# Patient Record
Sex: Female | Born: 1940 | Race: White | Hispanic: No | Marital: Married | Smoking: Former smoker
Health system: Southern US, Community
[De-identification: ages and names within clinical notes are randomized; demographics above are authoritative.]

## PROBLEM LIST (undated history)

## (undated) DIAGNOSIS — L570 Actinic keratosis: Secondary | ICD-10-CM

## (undated) HISTORY — DX: Actinic keratosis: L57.0

---

## 1998-04-17 HISTORY — PX: ABDOMINAL HYSTERECTOMY: SHX81

## 2005-05-01 ENCOUNTER — Ambulatory Visit: Payer: Self-pay | Admitting: Nurse Practitioner

## 2006-06-01 ENCOUNTER — Ambulatory Visit: Payer: Self-pay | Admitting: *Deleted

## 2006-08-17 ENCOUNTER — Ambulatory Visit: Payer: Self-pay | Admitting: *Deleted

## 2006-08-18 ENCOUNTER — Ambulatory Visit: Payer: Self-pay | Admitting: Gastroenterology

## 2007-06-22 ENCOUNTER — Ambulatory Visit: Payer: Self-pay | Admitting: *Deleted

## 2008-03-21 ENCOUNTER — Ambulatory Visit: Payer: Self-pay | Admitting: *Deleted

## 2008-08-23 ENCOUNTER — Ambulatory Visit: Payer: Self-pay | Admitting: Family Medicine

## 2009-04-17 HISTORY — PX: KNEE SURGERY: SHX244

## 2009-08-27 ENCOUNTER — Ambulatory Visit: Payer: Self-pay | Admitting: Family Medicine

## 2010-04-30 ENCOUNTER — Ambulatory Visit: Payer: Self-pay | Admitting: Family Medicine

## 2010-06-26 ENCOUNTER — Ambulatory Visit: Payer: Self-pay | Admitting: Family Medicine

## 2011-02-22 ENCOUNTER — Ambulatory Visit: Payer: Self-pay | Admitting: Orthopedic Surgery

## 2011-03-31 ENCOUNTER — Ambulatory Visit: Payer: Self-pay | Admitting: Orthopedic Surgery

## 2011-11-18 HISTORY — PX: OTHER SURGICAL HISTORY: SHX169

## 2013-11-01 ENCOUNTER — Ambulatory Visit: Payer: Self-pay | Admitting: Family Medicine

## 2013-11-07 ENCOUNTER — Ambulatory Visit: Payer: Self-pay | Admitting: Family Medicine

## 2013-11-14 ENCOUNTER — Ambulatory Visit: Payer: Self-pay | Admitting: Family Medicine

## 2013-11-14 HISTORY — PX: BREAST BIOPSY: SHX20

## 2013-11-15 LAB — PATHOLOGY REPORT

## 2014-11-02 ENCOUNTER — Ambulatory Visit: Payer: Self-pay | Admitting: Family Medicine

## 2015-08-28 ENCOUNTER — Other Ambulatory Visit: Payer: Self-pay | Admitting: Family Medicine

## 2015-08-28 DIAGNOSIS — Z1231 Encounter for screening mammogram for malignant neoplasm of breast: Secondary | ICD-10-CM

## 2015-11-05 ENCOUNTER — Ambulatory Visit: Payer: Self-pay | Attending: Family Medicine

## 2015-11-09 ENCOUNTER — Ambulatory Visit
Admission: RE | Admit: 2015-11-09 | Discharge: 2015-11-09 | Disposition: A | Discharge status: Home / Self Care | Payer: Medicare PPO | Source: Ambulatory Visit | Attending: Family Medicine | Admitting: Family Medicine

## 2015-11-09 ENCOUNTER — Other Ambulatory Visit: Payer: Self-pay | Admitting: Family Medicine

## 2015-11-09 DIAGNOSIS — Z1231 Encounter for screening mammogram for malignant neoplasm of breast: Secondary | ICD-10-CM

## 2016-10-22 ENCOUNTER — Other Ambulatory Visit: Payer: Self-pay | Admitting: Nurse Practitioner

## 2016-10-22 DIAGNOSIS — Z1231 Encounter for screening mammogram for malignant neoplasm of breast: Secondary | ICD-10-CM

## 2016-11-24 ENCOUNTER — Ambulatory Visit: Payer: Medicare PPO

## 2016-12-17 ENCOUNTER — Ambulatory Visit
Admission: RE | Admit: 2016-12-17 | Discharge: 2016-12-17 | Disposition: A | Discharge status: Home / Self Care | Payer: Medicare Other | Source: Ambulatory Visit | Attending: Nurse Practitioner | Admitting: Nurse Practitioner

## 2016-12-17 DIAGNOSIS — Z1231 Encounter for screening mammogram for malignant neoplasm of breast: Secondary | ICD-10-CM | POA: Insufficient documentation

## 2017-11-13 ENCOUNTER — Other Ambulatory Visit: Payer: Self-pay | Admitting: Endocrinology

## 2017-11-13 DIAGNOSIS — Z1231 Encounter for screening mammogram for malignant neoplasm of breast: Secondary | ICD-10-CM

## 2017-12-22 ENCOUNTER — Ambulatory Visit
Admission: RE | Admit: 2017-12-22 | Discharge: 2017-12-22 | Disposition: A | Discharge status: Home / Self Care | Payer: Medicare Other | Source: Ambulatory Visit | Attending: Endocrinology | Admitting: Endocrinology

## 2017-12-22 DIAGNOSIS — Z1231 Encounter for screening mammogram for malignant neoplasm of breast: Secondary | ICD-10-CM | POA: Diagnosis present

## 2018-11-29 ENCOUNTER — Other Ambulatory Visit: Payer: Self-pay | Admitting: Nurse Practitioner

## 2018-11-29 ENCOUNTER — Other Ambulatory Visit: Payer: Self-pay | Admitting: Endocrinology

## 2018-11-29 DIAGNOSIS — Z1231 Encounter for screening mammogram for malignant neoplasm of breast: Secondary | ICD-10-CM

## 2018-12-29 ENCOUNTER — Ambulatory Visit
Admission: RE | Admit: 2018-12-29 | Discharge: 2018-12-29 | Disposition: A | Discharge status: Home / Self Care | Payer: Medicare Other | Source: Ambulatory Visit | Attending: Nurse Practitioner | Admitting: Nurse Practitioner

## 2018-12-29 DIAGNOSIS — Z1231 Encounter for screening mammogram for malignant neoplasm of breast: Secondary | ICD-10-CM | POA: Diagnosis present

## 2019-11-28 ENCOUNTER — Other Ambulatory Visit: Payer: Self-pay | Admitting: Surgery

## 2019-11-28 DIAGNOSIS — Z1231 Encounter for screening mammogram for malignant neoplasm of breast: Secondary | ICD-10-CM

## 2020-02-28 ENCOUNTER — Ambulatory Visit: Payer: Medicare PPO | Admitting: Dermatology

## 2020-02-28 ENCOUNTER — Other Ambulatory Visit: Payer: Self-pay

## 2020-02-28 DIAGNOSIS — D2239 Melanocytic nevi of other parts of face: Secondary | ICD-10-CM

## 2020-02-28 DIAGNOSIS — L578 Other skin changes due to chronic exposure to nonionizing radiation: Secondary | ICD-10-CM | POA: Diagnosis not present

## 2020-02-28 DIAGNOSIS — L570 Actinic keratosis: Secondary | ICD-10-CM | POA: Diagnosis not present

## 2020-02-28 DIAGNOSIS — L814 Other melanin hyperpigmentation: Secondary | ICD-10-CM

## 2020-02-28 DIAGNOSIS — D229 Melanocytic nevi, unspecified: Secondary | ICD-10-CM

## 2020-02-28 NOTE — Progress Notes (Signed)
   Follow-Up Visit   Subjective  Caitlin Berry is a 79 y.o. female who presents for the following: Follow-up (AKs - nasal bridge and R cheek).   The following portions of the chart were reviewed this encounter and updated as appropriate:     Review of Systems: No other skin or systemic complaints.  Objective  Well appearing patient in no apparent distress; mood and affect are within normal limits.  A focused examination was performed including face. Relevant physical exam findings are noted in the Assessment and Plan.  Objective  Right Malar Cheek x 1: Residual erythematous thin macule with gritty scale. Nose is clear  Objective  Left Lower Cheek: 4.46mm speckled brown macule, present for >10 years, no changes per patient  Assessment & Plan   Actinic Damage - diffuse scaly erythematous macules with underlying dyspigmentation - Recommend daily broad spectrum sunscreen SPF 30+ to sun-exposed areas, reapply every 2 hours as needed.  - Call for new or changing lesions.  Lentigines - Scattered tan macules - Discussed due to sun exposure - Benign, observe - Call for any changes    AK (actinic keratosis) Right Malar Cheek x 1  Destruction of lesion - Right Malar Cheek x 1  Destruction method: cryotherapy   Informed consent: discussed and consent obtained   Lesion destroyed using liquid nitrogen: Yes   Region frozen until ice ball extended beyond lesion: Yes   Outcome: patient tolerated procedure well with no complications   Post-procedure details: wound care instructions given    Nevus Left Lower Cheek  Vs Lentigo  Benign-appearing.  Observation.  Call clinic for new or changing moles.  Recommend daily use of broad spectrum spf 30+ sunscreen to sun-exposed areas.    Return in about 1 year (around 02/27/2021) for TBSE.   IJamesetta Orleans, CMA, am acting as scribe for Brendolyn Patty, MD .

## 2020-02-28 NOTE — Patient Instructions (Signed)
Cryotherapy Aftercare  . Wash gently with soap and water everyday.   . Apply Vaseline and Band-Aid daily until healed.  

## 2020-08-08 ENCOUNTER — Other Ambulatory Visit: Payer: Self-pay

## 2020-08-08 ENCOUNTER — Ambulatory Visit
Admission: RE | Admit: 2020-08-08 | Discharge: 2020-08-08 | Disposition: A | Discharge status: Home / Self Care | Payer: Medicare PPO | Source: Ambulatory Visit | Attending: Surgery | Admitting: Surgery

## 2020-08-08 DIAGNOSIS — Z1231 Encounter for screening mammogram for malignant neoplasm of breast: Secondary | ICD-10-CM | POA: Insufficient documentation

## 2020-10-23 ENCOUNTER — Encounter: Payer: Self-pay | Admitting: Dermatology

## 2021-03-05 ENCOUNTER — Encounter: Payer: Medicare PPO | Admitting: Dermatology

## 2021-07-01 ENCOUNTER — Other Ambulatory Visit: Payer: Self-pay | Admitting: Family Medicine

## 2021-07-01 ENCOUNTER — Other Ambulatory Visit: Payer: Self-pay | Admitting: Surgery

## 2021-07-01 DIAGNOSIS — Z1231 Encounter for screening mammogram for malignant neoplasm of breast: Secondary | ICD-10-CM

## 2021-08-09 ENCOUNTER — Other Ambulatory Visit: Payer: Self-pay

## 2021-08-09 ENCOUNTER — Ambulatory Visit
Admission: RE | Admit: 2021-08-09 | Discharge: 2021-08-09 | Disposition: A | Discharge status: Home / Self Care | Payer: Medicare PPO | Source: Ambulatory Visit | Attending: Family Medicine | Admitting: Family Medicine

## 2021-08-09 DIAGNOSIS — Z1231 Encounter for screening mammogram for malignant neoplasm of breast: Secondary | ICD-10-CM | POA: Diagnosis present

## 2022-07-22 ENCOUNTER — Other Ambulatory Visit: Payer: Self-pay | Admitting: Family Medicine

## 2022-07-22 DIAGNOSIS — Z1231 Encounter for screening mammogram for malignant neoplasm of breast: Secondary | ICD-10-CM

## 2022-08-15 ENCOUNTER — Ambulatory Visit
Admission: RE | Admit: 2022-08-15 | Discharge: 2022-08-15 | Disposition: A | Discharge status: Home / Self Care | Payer: Medicare PPO | Source: Ambulatory Visit | Attending: Family Medicine | Admitting: Family Medicine

## 2022-08-15 DIAGNOSIS — Z1231 Encounter for screening mammogram for malignant neoplasm of breast: Secondary | ICD-10-CM | POA: Diagnosis not present

## 2023-03-16 DIAGNOSIS — E119 Type 2 diabetes mellitus without complications: Secondary | ICD-10-CM | POA: Insufficient documentation

## 2023-03-16 LAB — HM DIABETES FOOT EXAM

## 2023-07-21 ENCOUNTER — Other Ambulatory Visit: Payer: Self-pay | Admitting: Family Medicine

## 2023-07-21 DIAGNOSIS — Z1231 Encounter for screening mammogram for malignant neoplasm of breast: Secondary | ICD-10-CM

## 2023-08-08 IMAGING — MG MM DIGITAL SCREENING BILAT W/ TOMO AND CAD
8 series · 8 of 24 positions shown · non-contrast
Comparison: Previous exam(s).

CLINICAL DATA: Screening.

EXAM:
DIGITAL SCREENING BILATERAL MAMMOGRAM WITH TOMOSYNTHESIS AND CAD
TECHNIQUE: Bilateral screening digital craniocaudal and mediolateral oblique
mammograms were obtained. Bilateral screening digital breast
tomosynthesis was performed. The images were evaluated with
computer-aided detection.

[L CC synth-2D]
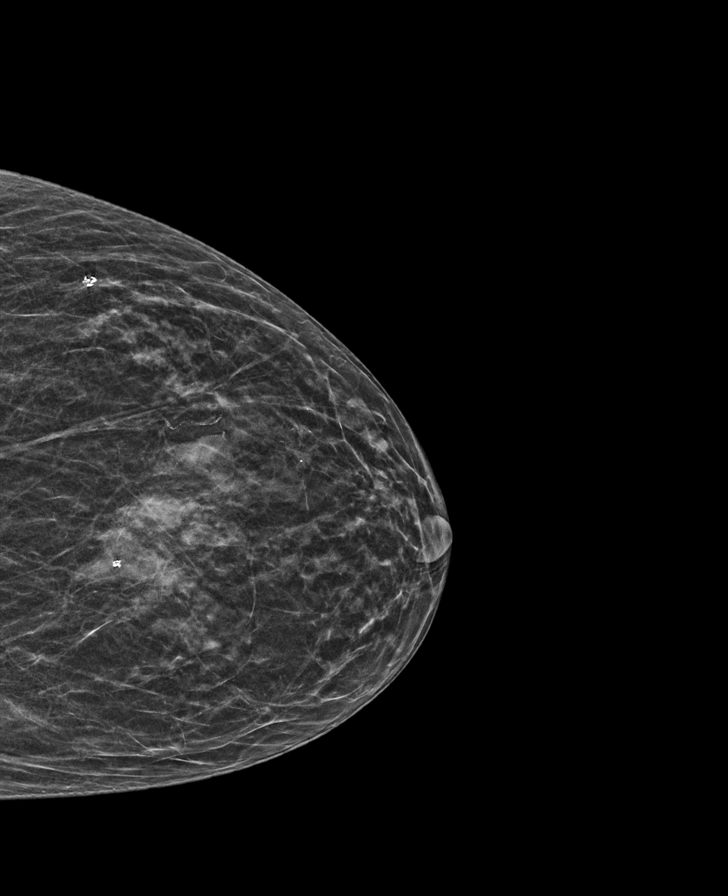

[R MLO synth-2D]
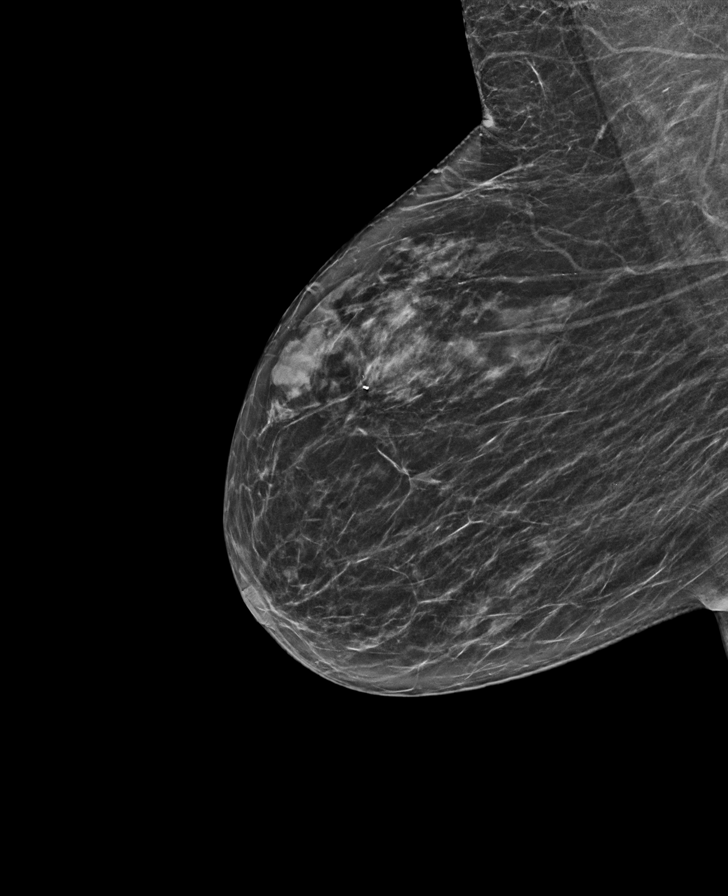

[R CC synth-2D]
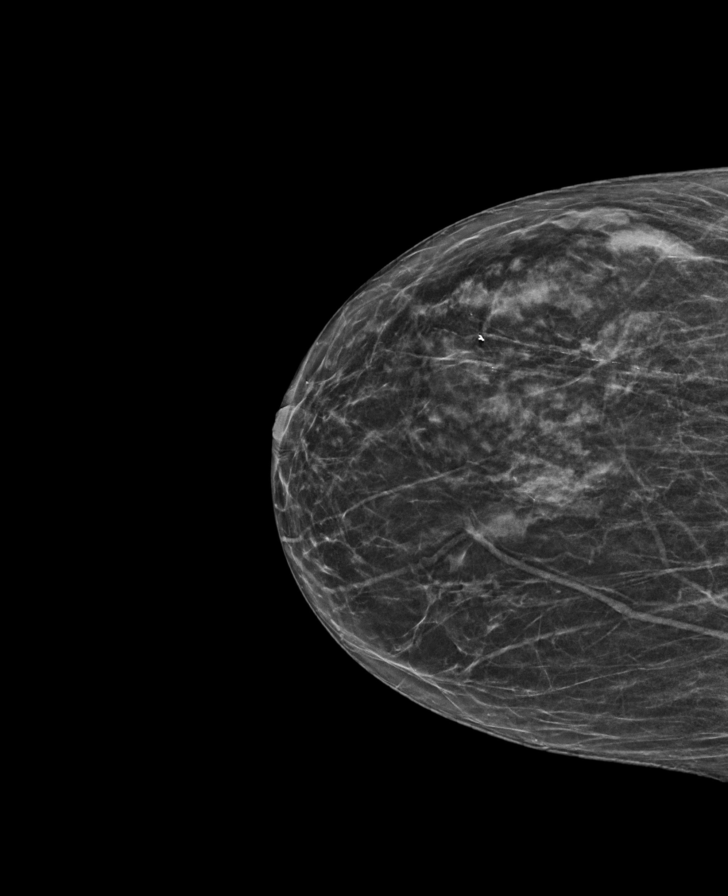

[L MLO synth-2D]
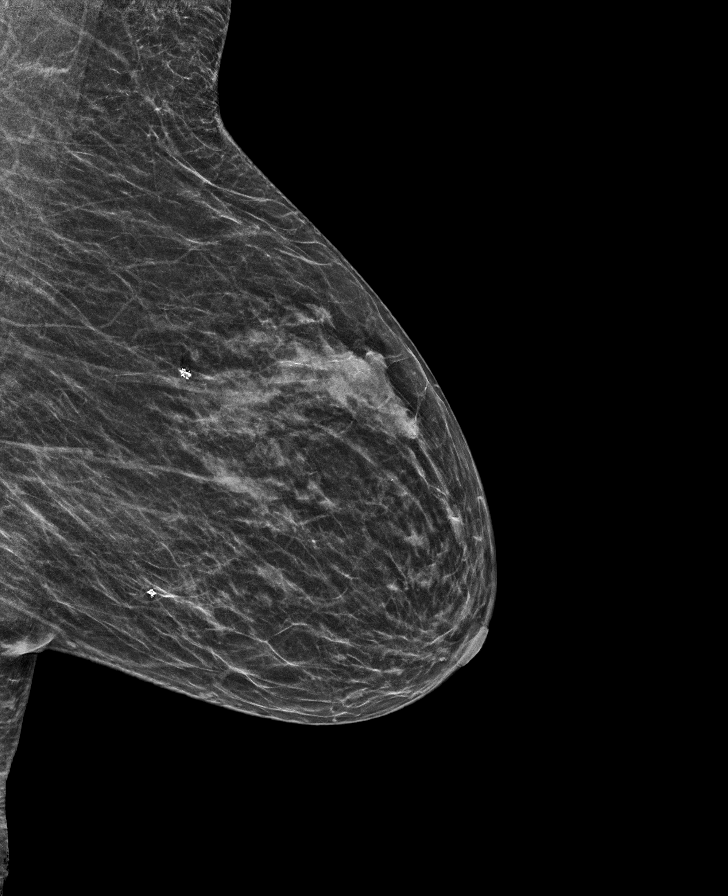

[R MLO tomo · tomo slice 31/62.0]
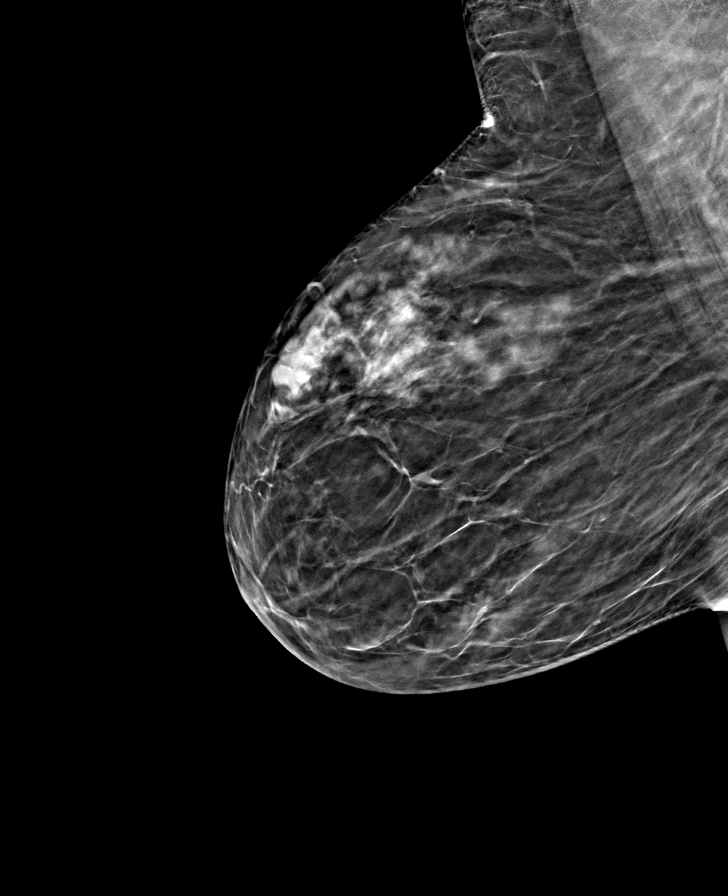

[L MLO tomo · tomo slice 24/47.0]
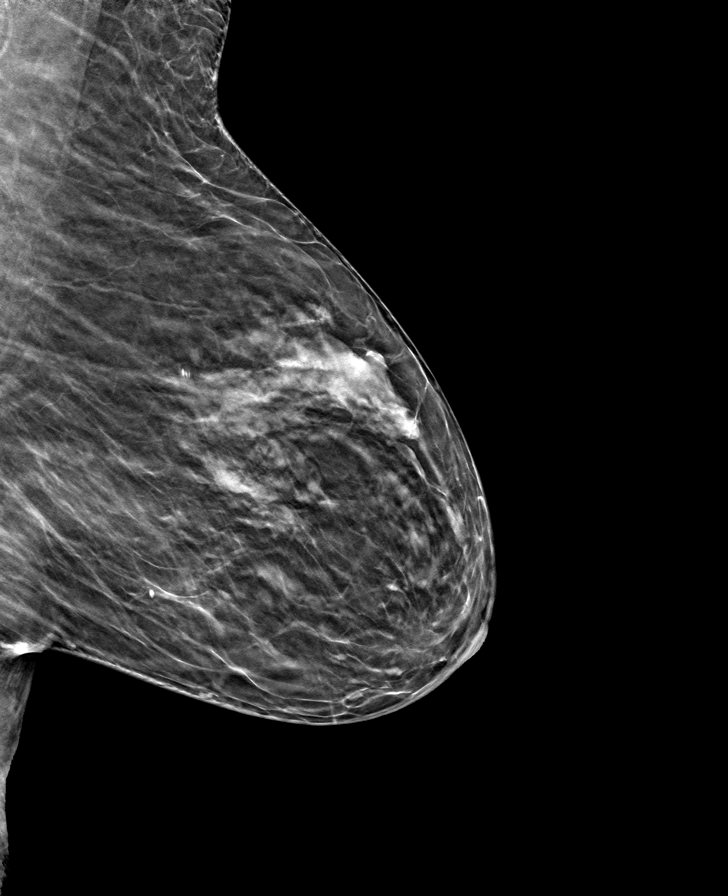

[R CC tomo · tomo slice 25/50.0]
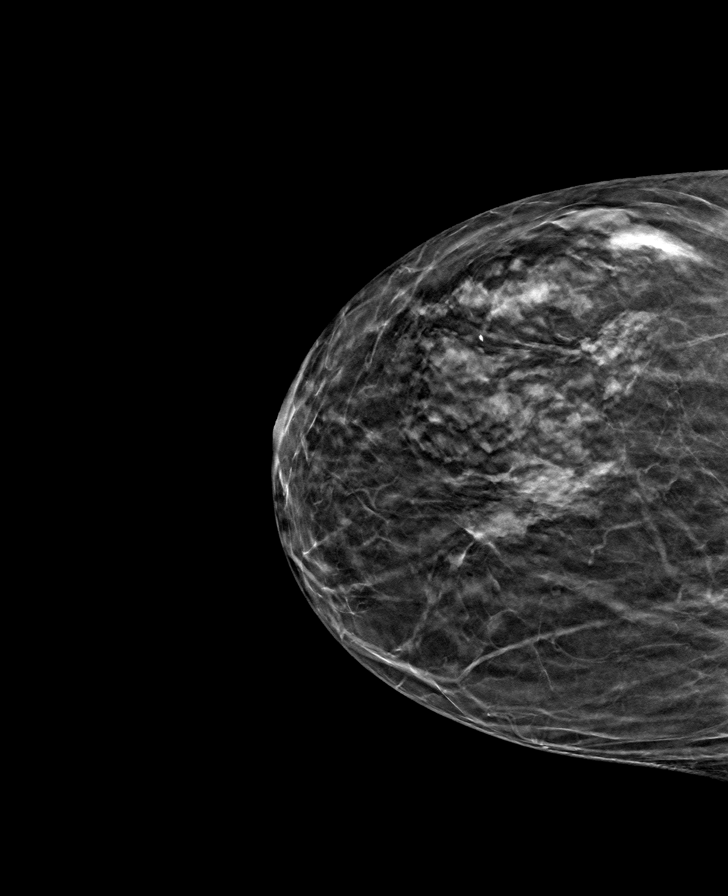

[L CC tomo · tomo slice 21/41.0]
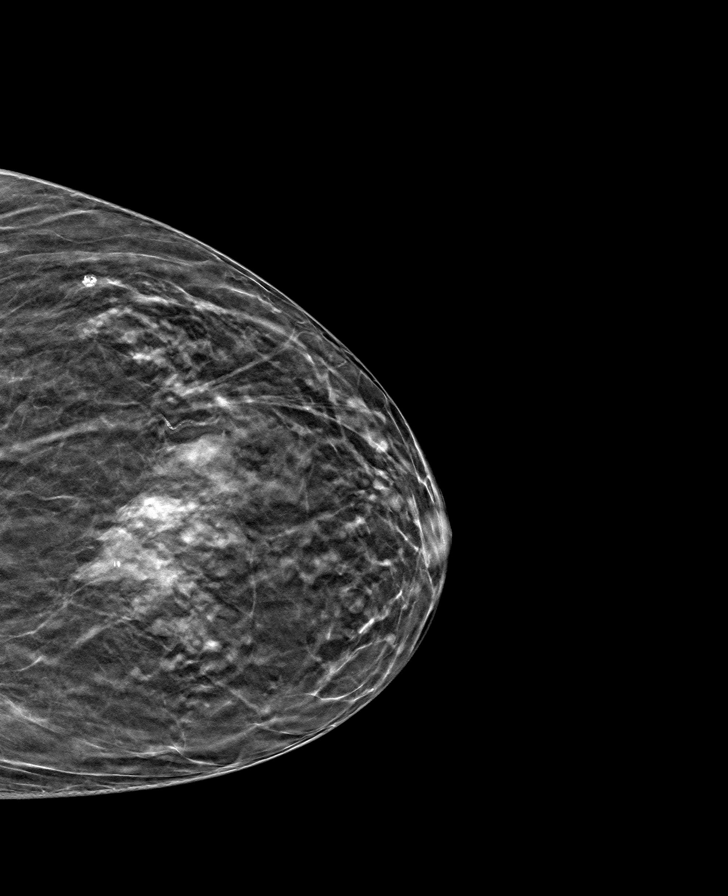

[8 of 24 positions shown; findings below may reference images not displayed]

ACR Breast Density Category b: There are scattered areas of
fibroglandular density.
FINDINGS: There are no findings suspicious for malignancy.
IMPRESSION: No mammographic evidence of malignancy. A result letter of this
screening mammogram will be mailed directly to the patient.

RECOMMENDATION:
Screening mammogram in one year. (Code:51-O-LD2)

BI-RADS CATEGORY  1: Negative.

## 2023-08-17 ENCOUNTER — Ambulatory Visit
Admission: RE | Admit: 2023-08-17 | Discharge: 2023-08-17 | Disposition: A | Discharge status: Home / Self Care | Payer: Medicare PPO | Source: Ambulatory Visit | Attending: Family Medicine | Admitting: Family Medicine

## 2023-08-17 DIAGNOSIS — Z1231 Encounter for screening mammogram for malignant neoplasm of breast: Secondary | ICD-10-CM | POA: Diagnosis present

## 2023-11-23 LAB — OPHTHALMOLOGY REPORT-SCANNED

## 2024-03-09 LAB — COMPREHENSIVE METABOLIC PANEL WITH GFR: eGFR: 90

## 2024-04-14 LAB — HM DEXA SCAN

## 2024-04-27 ENCOUNTER — Encounter: Payer: Self-pay | Admitting: Pediatrics

## 2024-04-27 ENCOUNTER — Ambulatory Visit: Admitting: Pediatrics

## 2024-04-27 VITALS — BP 166/90 | HR 76 | Temp 97.7°F | Ht 61.0 in | Wt 168.6 lb

## 2024-04-27 DIAGNOSIS — M4856XS Collapsed vertebra, not elsewhere classified, lumbar region, sequela of fracture: Secondary | ICD-10-CM | POA: Diagnosis not present

## 2024-04-27 DIAGNOSIS — R030 Elevated blood-pressure reading, without diagnosis of hypertension: Secondary | ICD-10-CM | POA: Diagnosis not present

## 2024-04-27 DIAGNOSIS — E039 Hypothyroidism, unspecified: Secondary | ICD-10-CM | POA: Diagnosis not present

## 2024-04-27 DIAGNOSIS — Z7689 Persons encountering health services in other specified circumstances: Secondary | ICD-10-CM

## 2024-04-27 DIAGNOSIS — E119 Type 2 diabetes mellitus without complications: Secondary | ICD-10-CM

## 2024-04-27 DIAGNOSIS — S32050G Wedge compression fracture of fifth lumbar vertebra, subsequent encounter for fracture with delayed healing: Secondary | ICD-10-CM

## 2024-04-27 NOTE — Patient Instructions (Addendum)
 Measure your blood pressure at home! Home Blood Pressure Monitoring is an important part of managing blood pressure and thought to be more accurate than the measures we get in the clinic.  Here's some tips on how to take your blood pressure accurately at home and some highly rated monitors. Most insurances (except for Medicaid) won't pay for monitors, so unfortunately they are an out-of-pocket expense for most people.   Taking an accurate blood pressure measurement: To get an accurate blood pressure reading, empty your bladder first, then rest in a seated position for at least 5 minutes. Ideally, no caffeine or tobacco use in last 30 minutes. Use an arm cuff (not wrist - see recommendations below) seated in a chair with a back next to a table or object that is high enough that you can rest your arm so the blood pressure cuff is at the level of your heart and you can lean back comfortably. Keep both feet on the floor and don't talk while the machine is working. Checking at different times of the day can be helpful to get an idea of your average numbers. Your goal blood pressure should be <140/90.   Good to meet you! Welcome to [facility]!  As your primary care doctor, I look forward to working with you to help you reach your health goals.  Please be aware of a couple of logistical items: - If you message me on mychart, it may take me 1-2 business days to get back to you. This is for non-urgent messaging.  - If you require urgent clinical attention, please call the clinic or present to urgent care/emergency room - If you have labs, I typically will send a message about them in 1-2 business days. - I am not here on Mondays, otherwise will be available from Tuesday-Friday during 8a-5pm.

## 2024-04-27 NOTE — Progress Notes (Signed)
 Establish Care Note  BP (!) 166/90   Pulse 76   Temp 97.7 F (36.5 C) (Oral)   Ht 5' 1 (1.549 m)   Wt 168 lb 9.6 oz (76.5 kg)   SpO2 98%   BMI 31.86 kg/m    Subjective:    Patient ID: Caitlin Berry, female    DOB: 07-09-1941, 83 y.o.   MRN: [id number]  HPI: Caitlin Berry is a 83 y.o. female  Chief Complaint  Patient presents with   Establish Care    Just to have a PCP under her belt , handicap placard needed      Establishing care, the following was discussed today:  Discussed the use of AI scribe software for clinical note transcription with the patient, who gave verbal consent to proceed.  History of Present Illness   Caitlin Berry is an 83 year old female who presents for management of multiple chronic conditions.  She was diagnosed with diabetes in 2001 or 2002 and has experienced challenges with medication management. Initially, she was prescribed metformin, which caused significant gastrointestinal distress and hair loss, including loss of eyebrows. She switched to Actos, which alleviated some symptoms but was not entirely satisfactory. Currently, she is on Janumet, which works adequately, though dietary adjustments are necessary to avoid gastrointestinal issues. She misses eating carrots and salads, which she enjoyed before her diabetes diagnosis.  She has a history of thyroid issues and was initially on Synthroid, which was not well-tolerated. After researching, she switched to Armour Thyroid, which has been adjusted several times. She reports improvement in symptoms, including hair regrowth, though her eyelashes are shorter and less thick than before.  She has a family history of osteoporosis, with both her mother and grandmother affected. She initially refused treatment due to her mother's negative experience with osteoporosis medications. She experienced an L5 compression fracture in March 2024, which occurred  spontaneously during sleep, causing severe pain. An L1 compression fracture occurred later due to a fall at her chiropractor's office. She underwent 12 weeks of physical therapy at [facility], which improved her condition, though she still experiences limitations in physical activity and pain when standing for extended periods.  She reports urinary incontinence, which has worsened with age, and impacts her daily life, requiring frequent clothing changes and bathing.  She has gained weight recently, now weighing 168 pounds, up from 150 pounds. She attributes this to decreased physical activity and plans to address it through increased exercise, including water aerobics and gym visits.        Current Outpatient Medications on File Prior to Visit  Medication Sig Dispense Refill   ACCU-CHEK SMARTVIEW test strip USE TO TEST BLOOD SUGAR THREE TIMES DAILY     ARMOUR THYROID 90 MG tablet Take 90 mg by mouth daily.     Cholecalciferol (VITAMIN D3) 250 MCG (10000 UT) capsule Take 10,000 Units by mouth daily.     JANUMET 50-1000 MG tablet Take 1 tablet by mouth 2 (two) times daily.     No current facility-administered medications on file prior to visit.    #HM Will review HM records and updated as needed.  Relevant past medical, surgical, family and social history reviewed and updated as indicated. Interim medical history since our last visit reviewed. Allergies and medications reviewed and updated.  ROS per HPI unless specifically indicated above     Objective:    BP (!) 166/90   Pulse 76   Temp 97.7 F (36.5  C) (Oral)   Ht 5' 1 (1.549 m)   Wt 168 lb 9.6 oz (76.5 kg)   SpO2 98%   BMI 31.86 kg/m   Wt Readings from Last 3 Encounters:  04/27/24 168 lb 9.6 oz (76.5 kg)     Physical Exam Constitutional:      Appearance: Normal appearance.  Pulmonary:     Effort: Pulmonary effort is normal.   Musculoskeletal:        General: Normal range of motion.   Skin:     Comments: Normal skin color   Neurological:     General: No focal deficit present.     Mental Status: She is alert. Mental status is at baseline.   Psychiatric:        Mood and Affect: Mood normal.        Behavior: Behavior normal.        Thought Content: Thought content normal.         04/27/2024    2:14 PM  Depression screen PHQ 2/9  Decreased Interest 1  Down, Depressed, Hopeless 1  PHQ - 2 Score 2  Altered sleeping 0  Tired, decreased energy 3  Change in appetite 0  Feeling bad or failure about yourself  0  Trouble concentrating 3  Moving slowly or fidgety/restless 0  Suicidal thoughts 0  PHQ-9 Score 8  Difficult doing work/chores Somewhat difficult        04/27/2024    2:14 PM  GAD 7 : Generalized Anxiety Score  Nervous, Anxious, on Edge 1  Control/stop worrying 1  Worry too much - different things 1  Trouble relaxing 0  Restless 1  Easily annoyed or irritable 3  Afraid - awful might happen 1  Total GAD 7 Score 8  Anxiety Difficulty Somewhat difficult       Assessment & Plan:  Assessment & Plan   Hypothyroidism, unspecified type Assessment & Plan: Managed with Armour Thyroid. Recent dose adjustments show potential improvement. Reports improvement in hair loss. - Continue Armour Thyroid at current dose. - Monitor thyroid function tests regularly.   Type 2 diabetes mellitus without complication, without long-term current use of insulin (HCC) Assessment & Plan: Long-standing diabetes managed with Janumet, better tolerated than metformin. High carbohydrate diet may affect glycemic control. She is aware of dietary monitoring importance. - Continue Janumet. - Monitor dietary intake, especially carbohydrates.   Compression fracture of lumbar spine, non-traumatic, sequela Assessment & Plan: Sustained L5 and L1 fractures. Initial physical therapy beneficial. Minimal pain but limits prolonged standing. Opted for self-directed rehabilitation. - Engage in  water aerobics and exercises at local gym   Elevated blood pressure reading Elevated blood pressure potentially situational. No prior history. Plans to monitor at home. - Monitor blood pressure at home daily. - Reassess blood pressure in two weeks.  Encounter to establish care Reviewed available patient record including history, medications, problem list. HM updated as able. Will review and/or request outside records (if applicable) and will fill remaining HM gaps as needed at follow up visit.   Follow up plan: Return in about 2 weeks (around 05/11/2024) for HTN.  Hadassah Letters, MD

## 2024-05-05 DIAGNOSIS — M4856XS Collapsed vertebra, not elsewhere classified, lumbar region, sequela of fracture: Secondary | ICD-10-CM | POA: Insufficient documentation

## 2024-05-05 NOTE — Assessment & Plan Note (Signed)
 Long-standing diabetes managed with Janumet, better tolerated than metformin. High carbohydrate diet may affect glycemic control. She is aware of dietary monitoring importance. - Continue Janumet. - Monitor dietary intake, especially carbohydrates.

## 2024-05-05 NOTE — Assessment & Plan Note (Signed)
 Managed with Armour Thyroid. Recent dose adjustments show potential improvement. Reports improvement in hair loss. - Continue Armour Thyroid at current dose. - Monitor thyroid function tests regularly.

## 2024-05-05 NOTE — Assessment & Plan Note (Signed)
 Sustained L5 and L1 fractures. Initial physical therapy beneficial. Minimal pain but limits prolonged standing. Opted for self-directed rehabilitation. - Engage in water aerobics and exercises at local gym

## 2024-05-13 ENCOUNTER — Ambulatory Visit: Admitting: Pediatrics

## 2024-05-13 ENCOUNTER — Encounter: Payer: Self-pay | Admitting: Pediatrics

## 2024-05-13 VITALS — BP 143/87 | HR 85 | Temp 98.5°F | Wt 165.4 lb

## 2024-05-13 DIAGNOSIS — E119 Type 2 diabetes mellitus without complications: Secondary | ICD-10-CM

## 2024-05-13 DIAGNOSIS — Z7984 Long term (current) use of oral hypoglycemic drugs: Secondary | ICD-10-CM

## 2024-05-13 DIAGNOSIS — M8000XS Age-related osteoporosis with current pathological fracture, unspecified site, sequela: Secondary | ICD-10-CM | POA: Diagnosis not present

## 2024-05-13 DIAGNOSIS — R030 Elevated blood-pressure reading, without diagnosis of hypertension: Secondary | ICD-10-CM

## 2024-05-13 DIAGNOSIS — E039 Hypothyroidism, unspecified: Secondary | ICD-10-CM

## 2024-05-13 DIAGNOSIS — Z23 Encounter for immunization: Secondary | ICD-10-CM

## 2024-05-13 NOTE — Patient Instructions (Addendum)
 Jardiance Rybelsus   Measure your blood pressure at home! Home Blood Pressure Monitoring is an important part of managing blood pressure and thought to be more accurate than the measures we get in the clinic.  Here's some tips on how to take your blood pressure accurately at home and some highly rated monitors. Most insurances (except for Medicaid) won't pay for monitors, so unfortunately they are an out-of-pocket expense for most people.   Taking an accurate blood pressure measurement: To get an accurate blood pressure reading, empty your bladder first, then rest in a seated position for at least 5 minutes. Ideally, no caffeine or tobacco use in last 30 minutes. Use an arm cuff (not wrist - see recommendations below) seated in a chair with a back next to a table or object that is high enough that you can rest your arm so the blood pressure cuff is at the level of your heart and you can lean back comfortably. Keep both feet on the floor and don't talk while the machine is working. Checking at different times of the day can be helpful to get an idea of your average numbers. Your goal blood pressure should be <140/90.

## 2024-05-13 NOTE — Progress Notes (Unsigned)
   Office Visit  BP (!) 148/84   Pulse 86   Temp 98.5 F (36.9 C) (Oral)   Wt 165 lb 6.4 oz (75 kg)   SpO2 97%   BMI 31.25 kg/m    Subjective:    Patient ID: Caitlin Berry, female    DOB: 04/27/1941, 83 y.o.   MRN: [id number]  Caitlin Berry is a 83 y.o. female  Chief Complaint  Patient presents with   Hypothyroidism    Discussed the use of AI scribe software for clinical note transcription with the patient, who gave verbal consent to proceed.  History of Present Illness     Relevant past medical, surgical, family and social history reviewed and updated as indicated. Interim medical history since our last visit reviewed. Allergies and medications reviewed and updated.  ROS per HPI unless specifically indicated above     Objective:    BP (!) 148/84   Pulse 86   Temp 98.5 F (36.9 C) (Oral)   Wt 165 lb 6.4 oz (75 kg)   SpO2 97%   BMI 31.25 kg/m   Wt Readings from Last 3 Encounters:  05/13/24 165 lb 6.4 oz (75 kg)  04/27/24 168 lb 9.6 oz (76.5 kg)     Physical Exam      05/13/2024    2:20 PM 04/27/2024    2:14 PM  Depression screen PHQ 2/9  Decreased Interest 1 1  Down, Depressed, Hopeless 2 1  PHQ - 2 Score 3 2  Altered sleeping 0 0  Tired, decreased energy 2 3  Change in appetite 0 0  Feeling bad or failure about yourself  0 0  Trouble concentrating 3 3  Moving slowly or fidgety/restless 0 0  Suicidal thoughts 0 0  PHQ-9 Score 8 8  Difficult doing work/chores Extremely dIfficult Somewhat difficult       05/13/2024    2:21 PM 04/27/2024    2:14 PM  GAD 7 : Generalized Anxiety Score  Nervous, Anxious, on Edge 3 1  Control/stop worrying 1 1  Worry too much - different things 1 1  Trouble relaxing 0 0  Restless 3 1  Easily annoyed or irritable 0 3  Afraid - awful might happen  1  Total GAD 7 Score  8  Anxiety Difficulty Very difficult Somewhat difficult       Assessment & Plan:  Assessment & Plan   There  are no diagnoses linked to this encounter.   Assessment and Plan Assessment & Plan      Follow up plan: No follow-ups on file.  Hadassah SHAUNNA Nett, MD

## 2024-05-17 ENCOUNTER — Encounter: Payer: Self-pay | Admitting: Pediatrics

## 2024-05-17 DIAGNOSIS — M810 Age-related osteoporosis without current pathological fracture: Secondary | ICD-10-CM | POA: Insufficient documentation

## 2024-05-17 NOTE — Assessment & Plan Note (Signed)
Follows w endo

## 2024-05-17 NOTE — Assessment & Plan Note (Signed)
 Recent infusion caused transient flu-like symptoms and elevated blood pressure, resolved in three days. Accepts side effects for long-term benefits. - Continue current osteoporosis treatment regimen. - Monitor for adverse effects from future infusions.

## 2024-05-17 NOTE — Assessment & Plan Note (Signed)
 Current metformin poorly tolerated. Blood glucose elevated. Hesitant about injectables, open to oral alternatives like Jardiance or Rybelsus. - Discuss Jardiance and Rybelsus with endocrinologist at next visit. - Monitor blood glucose levels and adjust treatment as needed.

## 2024-06-10 ENCOUNTER — Telehealth: Payer: Self-pay | Admitting: Pediatrics

## 2024-06-10 NOTE — Telephone Encounter (Signed)
 Copied from CRM [id number]. Topic: Medicare AWV >> Jun 10, 2024 10:08 AM Nathanel DEL wrote: Reason for CRM: Called 06/10/2024 to sched AWV - MAILBOX FULL  Nathanel Paschal; Care Guide Ambulatory Clinical Support [place] [facility] Direct Dial: [phone]

## 2024-07-15 LAB — HEMOGLOBIN A1C: Hemoglobin A1C: 6.8

## 2024-10-05 ENCOUNTER — Encounter: Payer: Self-pay | Admitting: Pediatrics

## 2024-10-05 ENCOUNTER — Telehealth: Payer: Self-pay | Admitting: Pediatrics

## 2024-10-05 DIAGNOSIS — Z1231 Encounter for screening mammogram for malignant neoplasm of breast: Secondary | ICD-10-CM

## 2024-10-05 NOTE — Telephone Encounter (Signed)
 Copied from CRM [id number]. Topic: Clinical - Request for Lab/Test Order >> Oct 05, 2024  1:55 PM Caitlin Berry wrote: Reason for CRM: Patient requesting mammogram order to be sent to [facility] at [facility] [phone]. Patient requesting for order to be placed before Dr. Herold leaves the practice.

## 2024-10-06 NOTE — Telephone Encounter (Signed)
 Routing to provider to sign off on order if appropriate.

## 2024-10-07 ENCOUNTER — Other Ambulatory Visit: Payer: Self-pay | Admitting: Pediatrics

## 2024-10-07 DIAGNOSIS — Z1231 Encounter for screening mammogram for malignant neoplasm of breast: Secondary | ICD-10-CM

## 2024-10-07 NOTE — Progress Notes (Signed)
 Mammo ordered per patient request  Caitlin SHAUNNA Nett, MD

## 2024-10-07 NOTE — Telephone Encounter (Signed)
 Called and notified patient that mammogram order has been placed. Also scheduled AWV as requested.

## 2024-11-01 ENCOUNTER — Encounter: Payer: Self-pay | Admitting: Pediatrics

## 2024-11-01 ENCOUNTER — Ambulatory Visit: Admitting: Pediatrics

## 2024-11-01 VITALS — BP 151/86 | HR 96 | Temp 98.4°F | Ht 61.0 in | Wt 171.0 lb

## 2024-11-01 DIAGNOSIS — Z0000 Encounter for general adult medical examination without abnormal findings: Secondary | ICD-10-CM

## 2024-11-01 NOTE — Progress Notes (Unsigned)
 Chief Complaint  Patient presents with   Diabetes   Hypothyroidism     Subjective:   Caitlin Berry is a 83 y.o. female who presents for a Medicare Annual Wellness Visit.  Visit info / Clinical Intake: Medicare Wellness Visit Type:: Subsequent Annual Wellness Visit Persons participating in visit and providing information:: patient Medicare Wellness Visit Mode:: In-person (required for WTM) Interpreter Needed?: No Pre-visit prep was completed: no AWV questionnaire completed by patient prior to visit?: no Living arrangements:: lives with spouse/significant other Patient's Overall Health Status Rating: very good Typical amount of pain: none Does pain affect daily life?: no Are you currently prescribed opioids?: no  Dietary Habits and Nutritional Risks How many meals a day?: 2 Eats fruit and vegetables daily?: yes Most meals are obtained by: preparing own meals In the last 2 weeks, have you had any of the following?: none Diabetic:: (!) yes Any non-healing wounds?: no How often do you check your BS?: 1 Would you like to be referred to a Nutritionist or for Diabetic Management? : no  Functional Status Activities of Daily Living (to include ambulation/medication): Independent Ambulation: Independent Medication Administration: Independent Home Management (perform basic housework or laundry): Independent Manage your own finances?: (!) no Primary transportation is: driving Concerns about vision?: no *vision screening is required for WTM* Concerns about hearing?: no  Fall Screening Falls in the past year?: 1 Number of falls in past year: 0 Was there an injury with Fall?: 1 Fall Risk Category Calculator: 2 Patient Fall Risk Level: Moderate Fall Risk  Fall Risk Patient at Risk for Falls Due to: No Fall Risks Fall risk Follow up: Falls evaluation completed  Home and Transportation Safety: All rugs have non-skid backing?: yes All stairs or steps have  railings?: yes Grab bars in the bathtub or shower?: yes Have non-skid surface in bathtub or shower?: yes Good home lighting?: yes Regular seat belt use?: yes Hospital stays in the last year:: no  Cognitive Assessment Difficulty concentrating, remembering, or making decisions? : yes Will 6CIT or Mini Cog be Completed: yes What year is it?: 0 points What month is it?: 0 points Give patient an address phrase to remember (5 components): [address] in [place]. [place] About what time is it?: 0 points Count backwards from 20 to 1: 0 points Say the months of the year in reverse: 0 points Repeat the address phrase from earlier: 2 points 6 CIT Score: 2 points  Advance Directives (For Healthcare) Does Patient Have a Medical Advance Directive?: Yes Does patient want to make changes to medical advance directive?: No - Patient declined Type of Advance Directive: Healthcare Power of [place]; Living will Copy of Healthcare Power of Attorney in Chart?: No - copy requested Copy of Living Will in Chart?: No - copy requested    Allergies (verified) Sulfa antibiotics, Tetracycline, Lactose, and Penicillin g   Current Medications (verified) Outpatient Encounter Medications as of 11/01/2024  Medication Sig   ACCU-CHEK SMARTVIEW test strip USE TO TEST BLOOD SUGAR THREE TIMES DAILY   ARMOUR THYROID 90 MG tablet Take 90 mg by mouth daily.   Cholecalciferol (VITAMIN D3) 250 MCG (10000 UT) capsule Take 10,000 Units by mouth daily.   JANUMET 50-1000 MG tablet Take 1 tablet by mouth 2 (two) times daily.   No facility-administered encounter medications on file as of 11/01/2024.    History: Past Medical History:  Diagnosis Date   Actinic keratosis    Past Surgical History:  Procedure Laterality Date  ABDOMINAL HYSTERECTOMY  04/1998   BREAST BIOPSY Right 11/14/2013   stereo biopsy-benign   cataract  2013   KNEE SURGERY  04/2009   Family History  Problem Relation Age of Onset    Cancer Mother    Melanoma Brother    Psoriasis Paternal Grandfather    Breast cancer Neg Hx    Social History   Occupational History   Not on file  Tobacco Use   Smoking status: Former    Current packs/day: 0.00    Types: Cigarettes    Quit date: 1997    Years since quitting: 28.9   Smokeless tobacco: Never  Vaping Use   Vaping status: Never Used  Substance and Sexual Activity   Alcohol use: Never   Drug use: Never   Sexual activity: Not Currently   Tobacco Counseling Counseling given: Not Answered  SDOH Screenings   Food Insecurity: No Food Insecurity (11/01/2024)  Housing: Unknown (11/01/2024)  Transportation Needs: No Transportation Needs (11/01/2024)  Utilities: Not At Risk (11/01/2024)  Depression (PHQ2-9): Medium Risk (05/13/2024)  Physical Activity: Inactive (11/01/2024)  Social Connections: Socially Integrated (11/01/2024)  Stress: No Stress Concern Present (11/01/2024)  Tobacco Use: Medium Risk (11/01/2024)  Health Literacy: Adequate Health Literacy (11/01/2024)   See flowsheets for full screening details  Depression Screen PHQ 2 & 9 Depression Scale- Over the past 2 weeks, how often have you been bothered by any of the following problems? Little interest or pleasure in doing things: 1 Feeling down, depressed, or hopeless (PHQ Adolescent also includes...irritable): 2 PHQ-2 Total Score: 3 Trouble falling or staying asleep, or sleeping too much: 0 Feeling tired or having little energy: 2 Poor appetite or overeating (PHQ Adolescent also includes...weight loss): 0 Feeling bad about yourself - or that you are a failure or have let yourself or your family down: 0 Trouble concentrating on things, such as reading the newspaper or watching television (PHQ Adolescent also includes...like school work): 3 Moving or speaking so slowly that other people could have noticed. Or the opposite - being so fidgety or restless that you have been moving around a lot more  than usual: 0 Thoughts that you would be better off dead, or of hurting yourself in some way: 0 PHQ-9 Total Score: 8 If you checked off any problems, how difficult have these problems made it for you to do your work, take care of things at home, or get along with other people?: Extremely dIfficult     Goals Addressed   None          Objective:    Today's Vitals   11/01/24 1351 11/01/24 1405  BP: (!) 174/94 (!) 151/86  Pulse: (!) 105 96  Temp: 98.4 F (36.9 C)   TempSrc: Oral   SpO2: 98%   Weight: 171 lb (77.6 kg)   Height: 5' 1 (1.549 m)   PainSc: 0-No pain    Body mass index is 32.31 kg/m.  Hearing/Vision screen No results found. Immunizations and Health Maintenance Health Maintenance  Topic Date Due   HEMOGLOBIN A1C  Never done   FOOT EXAM  Never done   OPHTHALMOLOGY EXAM  Never done   Diabetic kidney evaluation - eGFR measurement  Never done   Diabetic kidney evaluation - Urine ACR  Never done   DTaP/Tdap/Td (1 - Tdap) Never done   Pneumococcal Vaccine: 50+ Years (1 of 2 - PCV) Never done   Zoster Vaccines- Shingrix (1 of 2) Never done   Influenza Vaccine  Never done  COVID-19 Vaccine (2 - 2025-26 season) 07/18/2024   Medicare Annual Wellness (AWV)  11/01/2025   Bone Density Scan  Completed   Meningococcal B Vaccine  Aged Out        Assessment/Plan:  This is a routine wellness examination for Hopie.  Patient Care Team: Herold Hadassah SQUIBB, MD as PCP - General (Family Medicine)  I have personally reviewed and noted the following in the patients chart:   Medical and social history Use of alcohol, tobacco or illicit drugs  Current medications and supplements including opioid prescriptions. Functional ability and status Nutritional status Physical activity Advanced directives List of other physicians Hospitalizations, surgeries, and ER visits in previous 12 months Vitals Screenings to include cognitive, depression, and  falls Referrals and appointments  No orders of the defined types were placed in this encounter.  In addition, I have reviewed and discussed with patient certain preventive protocols, quality metrics, and best practice recommendations. A written personalized care plan for preventive services as well as general preventive health recommendations were provided to patient.   Laymon LOISE Metro, CMA   11/01/2024   No follow-ups on file.  After Visit Summary: {CHL AMB AWV After Visit Summary:[phone]}  Nurse Notes: ***

## 2024-11-01 NOTE — Patient Instructions (Signed)
 Ms. Ucci,  Thank you for taking the time for your Medicare Wellness Visit. I appreciate your continued commitment to your health goals. Please review the care plan we discussed, and feel free to reach out if I can assist you further.  Please note that Annual Wellness Visits do not include a physical exam. Some assessments may be limited, especially if the visit was conducted virtually. If needed, we may recommend an in-person follow-up with your provider.  Ongoing Care Seeing your primary care provider every 3 to 6 months helps us  monitor your health and provide consistent, personalized care.   Referrals If a referral was made during today's visit and you haven't received any updates within two weeks, please contact the referred provider directly to check on the status.  Recommended Screenings (please note your endocrinologist has your A1c/diabetes care up to date so not fully accurate list below):  Health Maintenance  Topic Date Due   Hemoglobin A1C  Never done   Complete foot exam   Never done   Eye exam for diabetics  Never done   Yearly kidney function blood test for diabetes  Never done   Yearly kidney health urinalysis for diabetes  Never done   COVID-19 Vaccine (2 - 2025-26 season) 11/17/2024*   Zoster (Shingles) Vaccine (1 of 2) 01/30/2025*   Flu Shot  02/14/2025*   DTaP/Tdap/Td vaccine (1 - Tdap) 11/01/2025*   Pneumococcal Vaccine for age over 42 (1 of 2 - PCV) 11/01/2025*   Medicare Annual Wellness Visit  11/01/2025   Osteoporosis screening with Bone Density Scan  Completed   Meningitis B Vaccine  Aged Out  *Topic was postponed. The date shown is not the original due date.       11/01/2024    1:57 PM  Advanced Directives  Does Patient Have a Medical Advance Directive? Yes  Type of Estate Agent of [place];Living will  Does patient want to make changes to medical advance directive? No - Patient declined  Copy of Healthcare Power of  Attorney in Chart? No - copy requested    Vision: Annual vision screenings are recommended for early detection of glaucoma, cataracts, and diabetic retinopathy. These exams can also reveal signs of chronic conditions such as diabetes and high blood pressure.  Dental: Annual dental screenings help detect early signs of oral cancer, gum disease, and other conditions linked to overall health, including heart disease and diabetes.  Please see the attached documents for additional preventive care recommendations.

## 2024-11-06 ENCOUNTER — Encounter: Payer: Self-pay | Admitting: Pediatrics

## 2024-11-11 ENCOUNTER — Encounter: Payer: Self-pay | Admitting: Pediatrics

## 2024-11-15 LAB — HM DIABETES EYE EXAM

## 2024-11-16 ENCOUNTER — Encounter: Payer: Self-pay | Admitting: Pediatrics

## 2024-11-22 ENCOUNTER — Ambulatory Visit
Admission: RE | Admit: 2024-11-22 | Discharge: 2024-11-22 | Disposition: A | Discharge status: Home / Self Care | Source: Ambulatory Visit | Attending: Pediatrics | Admitting: Pediatrics

## 2024-11-22 DIAGNOSIS — Z1231 Encounter for screening mammogram for malignant neoplasm of breast: Secondary | ICD-10-CM | POA: Insufficient documentation

## 2025-05-08 ENCOUNTER — Encounter: Admitting: Family Medicine
